# Patient Record
Sex: Female | Born: 1975
Health system: Southern US, Community
[De-identification: ages and names within clinical notes are randomized; demographics above are authoritative.]

## PROBLEM LIST (undated history)

## (undated) DIAGNOSIS — G43909 Migraine, unspecified, not intractable, without status migrainosus: Secondary | ICD-10-CM

---

## 2010-12-05 ENCOUNTER — Ambulatory Visit: Payer: Self-pay | Admitting: Internal Medicine

## 2016-02-27 ENCOUNTER — Encounter: Payer: Self-pay | Admitting: Emergency Medicine

## 2016-02-27 ENCOUNTER — Emergency Department
Admission: EM | Admit: 2016-02-27 | Discharge: 2016-02-27 | Disposition: A | Discharge status: Home / Self Care | Payer: BLUE CROSS/BLUE SHIELD | Attending: Emergency Medicine | Admitting: Emergency Medicine

## 2016-02-27 ENCOUNTER — Emergency Department: Payer: BLUE CROSS/BLUE SHIELD

## 2016-02-27 DIAGNOSIS — G43909 Migraine, unspecified, not intractable, without status migrainosus: Secondary | ICD-10-CM

## 2016-02-27 DIAGNOSIS — R51 Headache: Secondary | ICD-10-CM | POA: Diagnosis present

## 2016-02-27 HISTORY — DX: Migraine, unspecified, not intractable, without status migrainosus: G43.909

## 2016-02-27 MED ORDER — BUTALBITAL-APAP-CAFFEINE 50-325-40 MG PO TABS
2.0000 | ORAL_TABLET | Freq: Once | ORAL | Status: AC
  Administered 2016-02-27: 2 via ORAL
  Filled 2016-02-27: qty 2

## 2016-02-27 MED ORDER — DIPHENHYDRAMINE HCL 25 MG PO CAPS
50.0000 mg | ORAL_CAPSULE | Freq: Once | ORAL | Status: AC
  Administered 2016-02-27: 50 mg via ORAL

## 2016-02-27 MED ORDER — BUTALBITAL-APAP-CAFFEINE 50-325-40 MG PO TABS
ORAL_TABLET | ORAL | Status: AC
  Administered 2016-02-27: 2 via ORAL
  Filled 2016-02-27: qty 1

## 2016-02-27 MED ORDER — BUTALBITAL-APAP-CAFFEINE 50-325-40 MG PO TABS
ORAL_TABLET | ORAL | Status: AC
  Filled 2016-02-27: qty 1

## 2016-02-27 MED ORDER — BUTALBITAL-APAP-CAFFEINE 50-325-40 MG PO TABS: 1.0000 | ORAL_TABLET | Freq: Four times a day (QID) | ORAL | 0 refills | Status: AC | PRN

## 2016-02-27 MED ORDER — DIPHENHYDRAMINE HCL 25 MG PO CAPS
ORAL_CAPSULE | ORAL | Status: AC
  Administered 2016-02-27: 50 mg via ORAL
  Filled 2016-02-27: qty 2

## 2016-02-27 NOTE — ED Provider Notes (Signed)
[facility] Emergency Department Provider Note  Time seen: 6:58 PM  I have reviewed the triage vital signs and the nursing notes.   HISTORY  Chief Complaint Headache    HPI Monica Hall is a 40 y.o. female with a past medical history of migraines who presents the emergency department with a headache. According to the patient for the past 3 days she has been experiencing a significant headache which she describes as a pressure sensation mostly to the top and back of her head. Denies any weakness or numbness. Denies fever. Patient states headache is currently at 8/10, states she has had migraines before but this one is lasting longer than normal. She does state photo and phonophobia which is typical of her migraines.  Past Medical History:  Diagnosis Date  . Migraines     There are no active problems to display for this patient.   Past Surgical History:  Procedure Laterality Date  . CESAREAN SECTION      Prior to Admission medications   Not on File    No Known Allergies  History reviewed. No pertinent family history.  Social History Social History  Substance Use Topics  . Smoking status: Never Smoker  . Smokeless tobacco: Not on file  . Alcohol use No    Review of Systems Constitutional: Negative for fever. Cardiovascular: Negative for chest pain. Respiratory: Negative for shortness of breath. Gastrointestinal: Negative for abdominal pain Neurological: 8/10 headache. Denies focal weakness or numbness. 10-point ROS otherwise negative.  ____________________________________________   PHYSICAL EXAM:  VITAL SIGNS: ED Triage Vitals  Enc Vitals Group     BP 02/27/16 1837 135/69     Pulse Rate 02/27/16 1837 75     Resp 02/27/16 1837 18     Temp 02/27/16 1837 97.6 F (36.4 C)     Temp Source 02/27/16 1837 Oral     SpO2 02/27/16 1837 97 %     Weight 02/27/16 1835 235 lb (106.6 kg)     Height 02/27/16 1835 5\' 4"  (1.626 m)     Head  Circumference --      Peak Flow --      Pain Score 02/27/16 1835 7     Pain Loc --      Pain Edu? --      Excl. in GC? --     Constitutional: Alert and oriented. Well appearing and in no distress. Eyes: Normal exam ENT   Head: Normocephalic and atraumatic.   Mouth/Throat: Mucous membranes are moist. Cardiovascular: Normal rate, regular rhythm. No murmur Respiratory: Normal respiratory effort without tachypnea nor retractions. Breath sounds are clear Gastrointestinal: Soft and nontender. No distention. Musculoskeletal: Nontender with normal range of motion in all extremities.  Neurologic:  Normal speech and language. No gross focal neurologic deficits. Equal grip strengths. Skin:  Skin is warm, dry and intact.  Psychiatric: Mood and affect are normal. Speech and behavior are normal.   ____________________________________________   RADIOLOGY  CT negative  ____________________________________________   INITIAL IMPRESSION / ASSESSMENT AND PLAN / ED COURSE  Pertinent labs & imaging results that were available during my care of the patient were reviewed by me and considered in my medical decision making (see chart for details).  Overall well-appearing patient who states an 8/10 headache/pressure sensation to the top back for head. Normal exam, normal neurological exam. Overall appears well. She describes a pressure sensation which she states is somewhat different than her typical migraine but she does have photo and phonophobia  which is typical for migraine. We'll obtain a CT scan head as the patient states she has never had a CT and only started experiencing migraines 2 years ago. We will treat with oral medications and closely monitor in the emergency department.   CT negative. Patient states her headache is much improved but still present. I discussed further treatment with Toradol, the patient states she is ready to go home, we'll prescribe Fioricet and have the patient  follow up with neurology if she continues to have headaches.   ____________________________________________   FINAL CLINICAL IMPRESSION(S) / ED DIAGNOSES  Migraine headache   Minna Antis, MD 02/27/16 2012

## 2016-02-27 NOTE — ED Triage Notes (Signed)
Feels like hears a "popping" noise. Pt ambulatory in triage.

## 2016-02-27 NOTE — ED Triage Notes (Signed)
Pt reports had had these headaches for past 2 or 3 months on and off. Started with one today and reports has not gone away and normally goes away with her medication. Pt feels jittery. Speech clear. Denies vision changes. Feels like having trouble focusing because of pressure in head.

## 2017-01-15 IMAGING — CT CT HEAD W/O CM
3 series · 14 of 44 positions shown, 16 images · non-contrast
Comparison: None.

CLINICAL DATA: Intermittent headaches over the past 2 3 months.
Sensation of head pressure.

EXAM:
CT HEAD WITHOUT CONTRAST
TECHNIQUE: Contiguous axial images were obtained from the base of the skull
through the vertex without intravenous contrast.

[Series 2: head wo · axial · 0.41mm/px · z∈[-38,+72]mm · 8 of 27 slices shown, 10 images]
[im 3/27  brain]
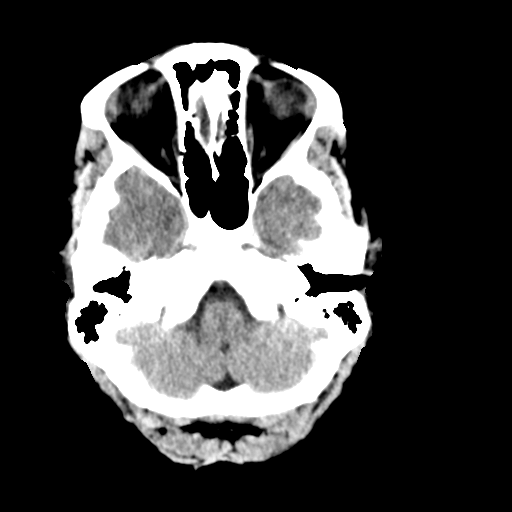
[im 3/27  bone]
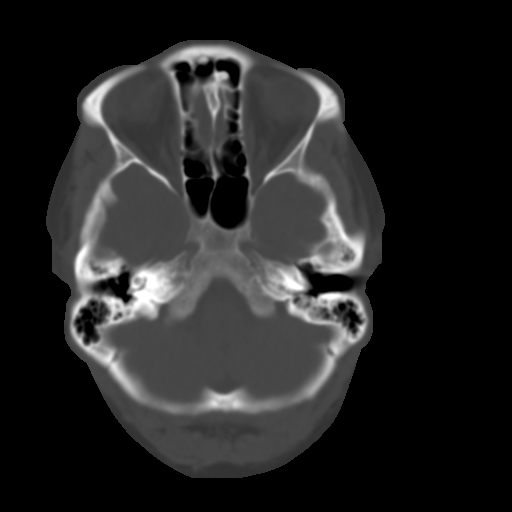
[im 6/27  brain]
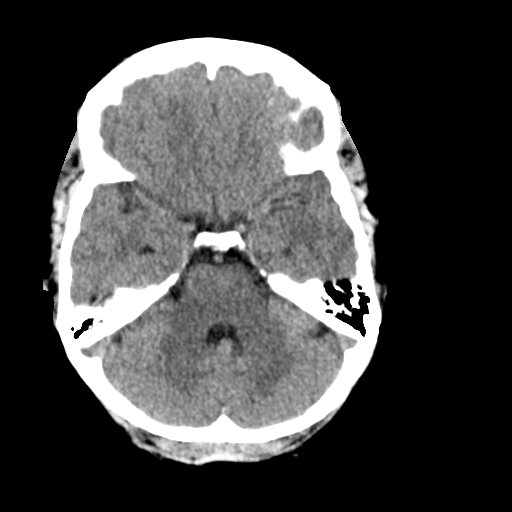
[im 9/27  brain]
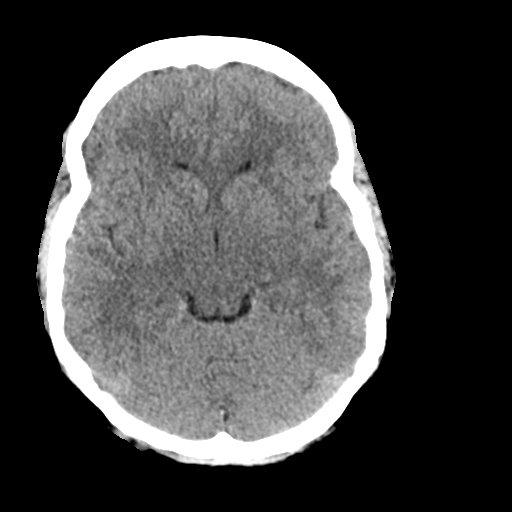
[im 12/27  brain]
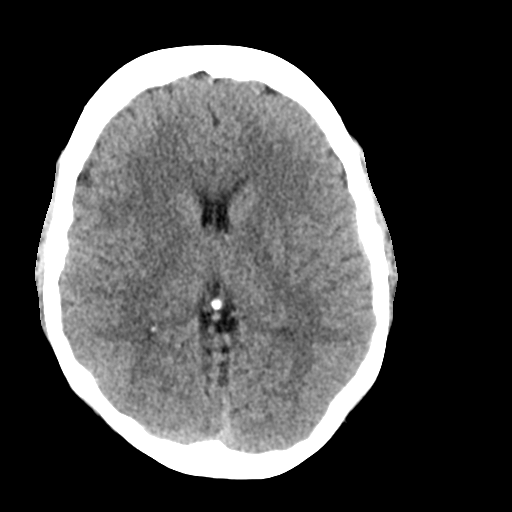
[im 16/27  brain]
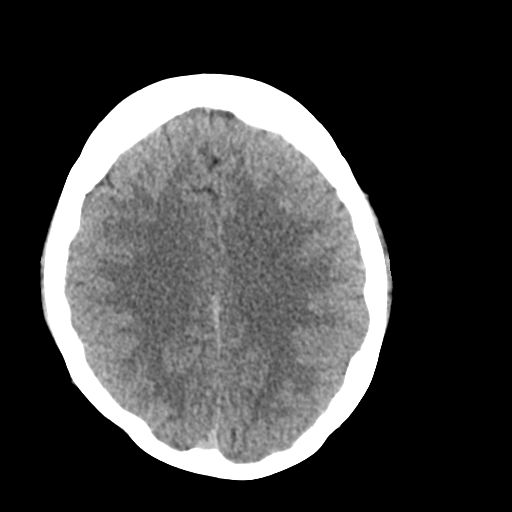
[im 16/27  bone]
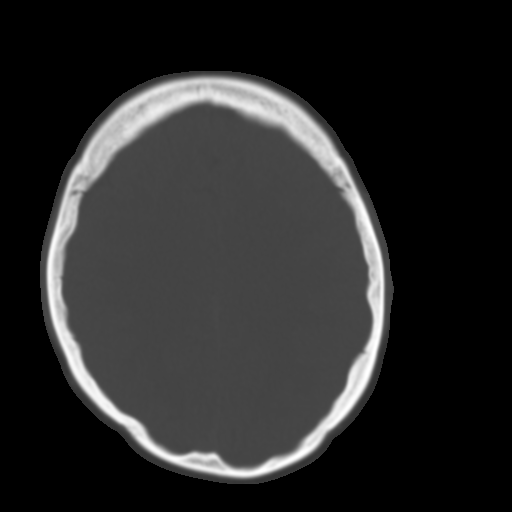
[im 19/27  brain]
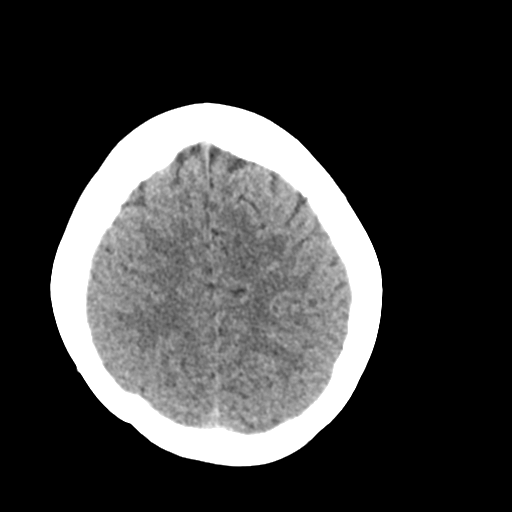
[im 22/27  brain]
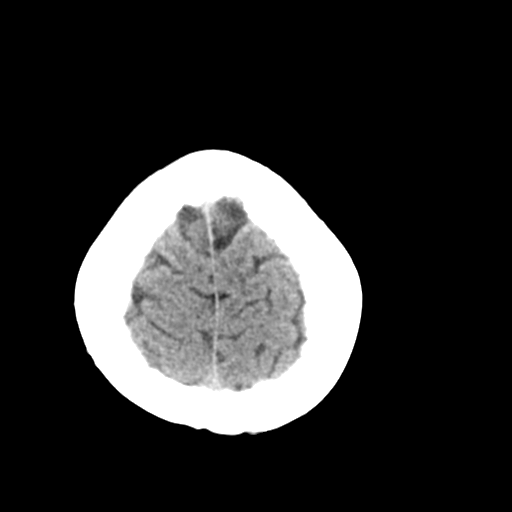
[im 25/27  brain]
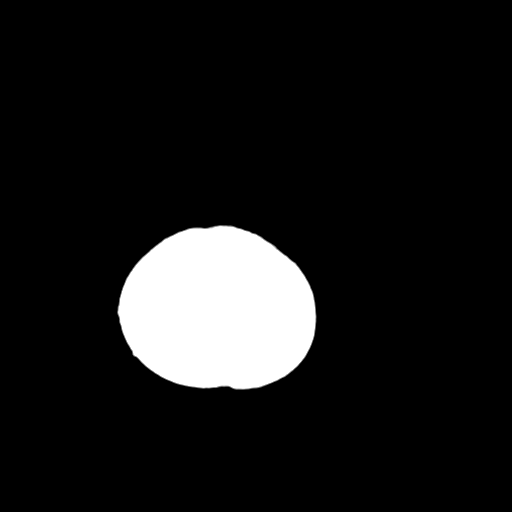

[Series 4: coronal soft tissue · coronal · 0.27mm/px · 3 of 60 slices shown]
[im 20/60  brain]
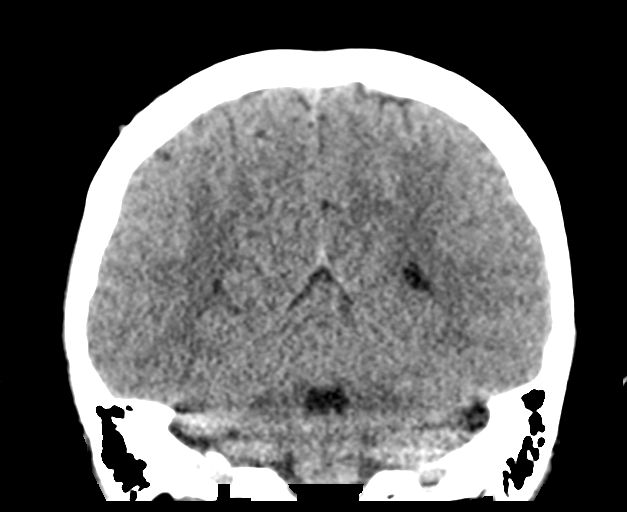
[im 27/60  brain]
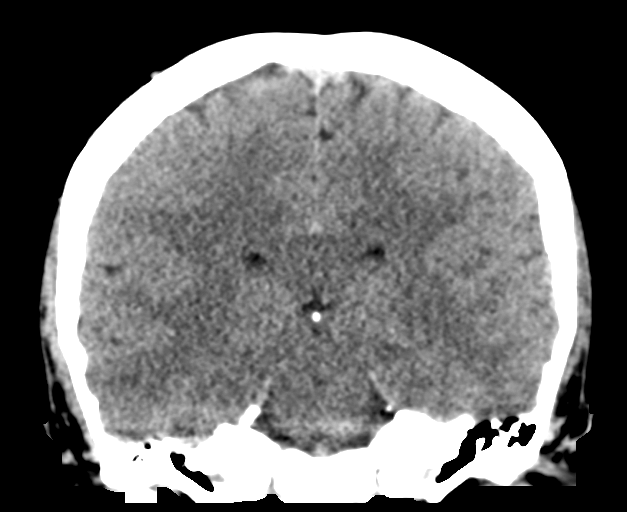
[im 33/60  brain]
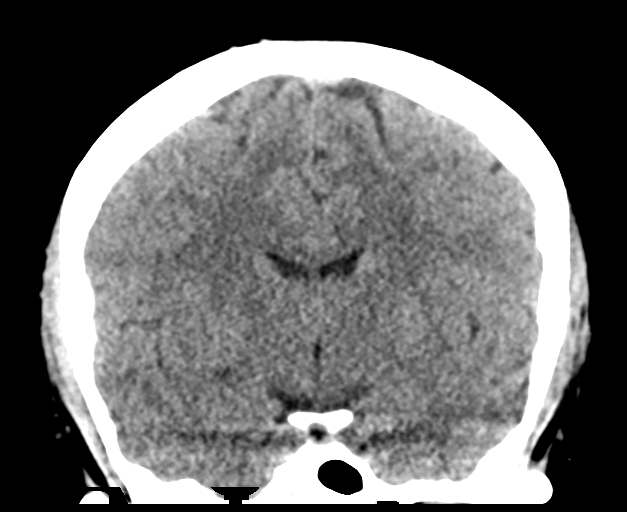

[Series 5: sagittal soft tissue · sagittal · 0.27mm/px · 3 of 53 slices shown]
[im 18/53  brain]
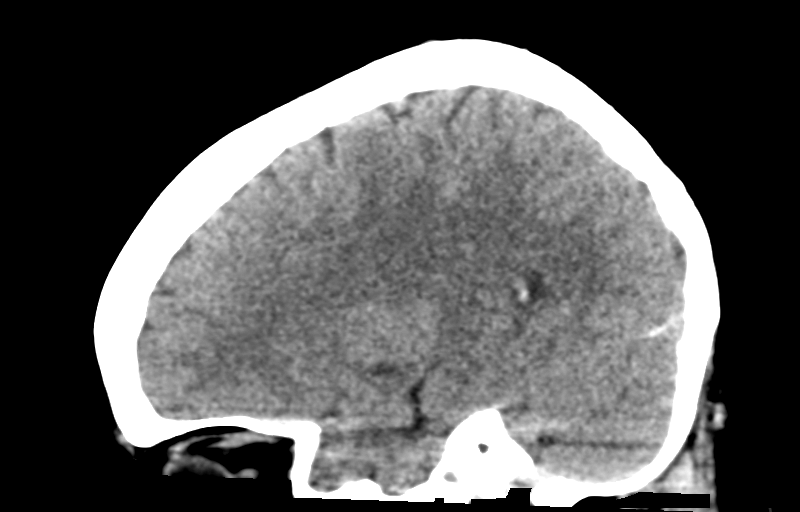
[im 27/53  brain]
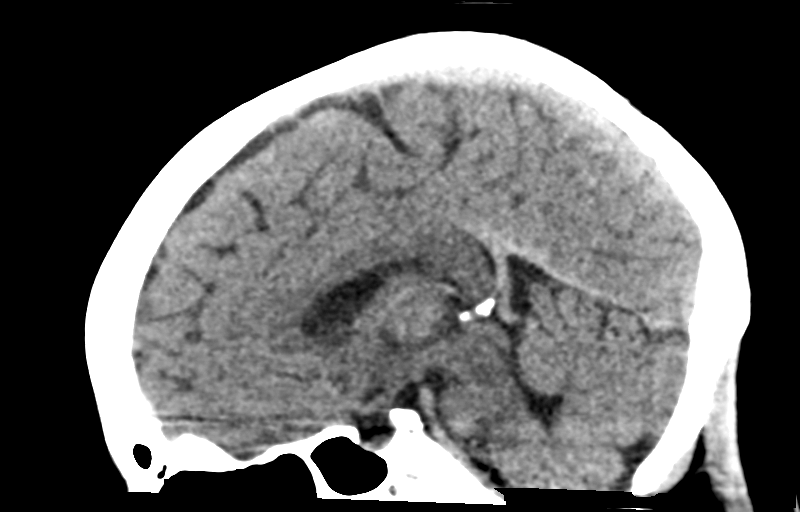
[im 35/53  brain]
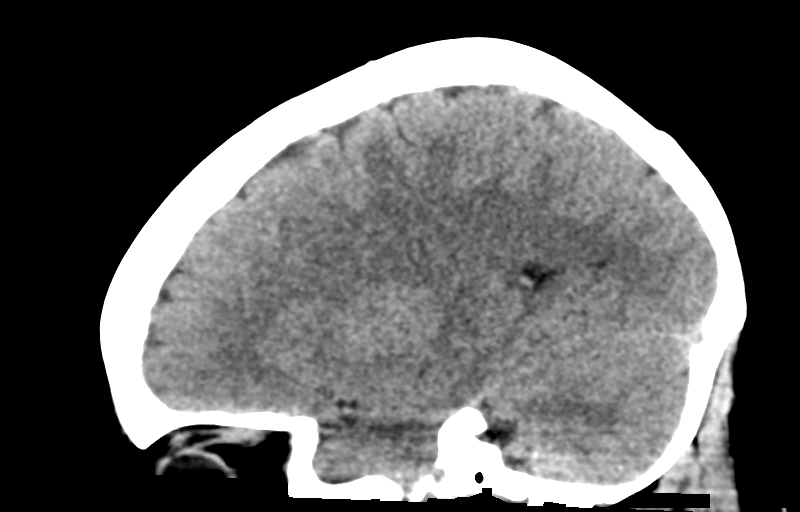

[14 of 44 positions shown; findings below may reference images not displayed]

FINDINGS: Brain: The brainstem, cerebellum, cerebral peduncles, thalami, basal
ganglia, basilar cisterns, and ventricular system appear within
normal limits. No intracranial hemorrhage, mass lesion, or acute
CVA.

Vascular: Unremarkable

Skull: Unremarkable

Sinuses/Orbits: Normal where visualized.

Other: No supplemental non-categorized findings.
IMPRESSION: 1.  No significant abnormality identified.

## 2018-07-27 ENCOUNTER — Encounter: Payer: Self-pay | Admitting: Family Medicine

## 2018-07-27 ENCOUNTER — Ambulatory Visit (INDEPENDENT_AMBULATORY_CARE_PROVIDER_SITE_OTHER): Payer: BLUE CROSS/BLUE SHIELD | Admitting: Family Medicine

## 2018-07-27 VITALS — BP 145/85 | HR 89 | Ht 65.0 in | Wt 244.0 lb

## 2018-07-27 DIAGNOSIS — Z01419 Encounter for gynecological examination (general) (routine) without abnormal findings: Secondary | ICD-10-CM

## 2018-07-27 DIAGNOSIS — Z0000 Encounter for general adult medical examination without abnormal findings: Secondary | ICD-10-CM

## 2018-07-27 DIAGNOSIS — R5383 Other fatigue: Secondary | ICD-10-CM

## 2018-07-27 NOTE — Progress Notes (Signed)
Last Pap pt thinks last Pap was when she got Mirena 2015, pt states it was normal. Mirena is due to come out and pt would like discuss options

## 2018-07-27 NOTE — Progress Notes (Signed)
   GYNECOLOGY ANNUAL PREVENTATIVE CARE ENCOUNTER NOTE  Subjective:   Monica Hall is a 43 y.o. [id number] female here for a routine annual gynecologic exam.  Current complaints: migraines, IUD in place since 2015, weight gain.   Denies abnormal vaginal bleeding, discharge, pelvic pain, problems with intercourse or other gynecologic concerns.    Gynecologic History No LMP recorded. (Menstrual status: IUD). Contraception: IUD Last Pap: in 2015. Results were: normal Last mammogram: NA@50   The following portions of the patient's history were reviewed and updated as appropriate: allergies, current medications, past family history, past medical history, past social history, past surgical history and problem list.  Review of Systems Pertinent items are noted in HPI.   Objective:  BP (!) 145/85   Pulse 89   Ht 5\' 5"  (1.651 m)   Wt 244 lb (110.7 kg)   BMI 40.60 kg/m  CONSTITUTIONAL: Well-developed, well-nourished female in no acute distress.  HENT:  Normocephalic, atraumatic, External right and left ear normal. Oropharynx is clear and moist EYES:  No scleral icterus.  NECK: Normal range of motion, supple, no masses.  Normal thyroid.  SKIN: Skin is warm and dry. No rash noted. Not diaphoretic. No erythema. No pallor. NEUROLOGIC: Alert and oriented to person, place, and time. Normal reflexes, muscle tone coordination. No cranial nerve deficit noted. PSYCHIATRIC: Normal mood and affect. Normal behavior. Normal judgment and thought content. CARDIOVASCULAR: Normal heart rate noted, regular rhythm. 2+ distal pulses. RESPIRATORY: Effort and breath sounds normal, no problems with respiration noted. BREASTS: Symmetric in size. No masses, skin changes, nipple drainage, or lymphadenopathy. ABDOMEN: Soft,  no distention noted.  No tenderness, rebound or guarding.  PELVIC: deferred until IUD placement MUSCULOSKELETAL: Normal range of motion.   Assessment and Plan:  1) Annual gynecologic examination:  Pap at IUD insertion visit.  Will follow up results of pap smear and manage accordingly.  Declines STI screen.  Routine preventative health maintenance measures emphasized.  2) Contraception counseling: Reviewed all forms of birth control options available including abstinence; over the counter/barrier methods; hormonal contraceptive medication including pill, patch, ring, injection,contraceptive implant; hormonal and nonhormonal IUDs; permanent sterilization options including vasectomy and the various tubal sterilization modalities. Risks and benefits reviewed.  Questions were answered.  Written information was also given to the patient to review.  Patient desires IUD removal/reinsertion- will be scheduled.  She was told to call with any further questions, or with any concerns about this method of contraception.  Emphasized use of condoms 100% of the time for STI prevention.  3) Weight gain: counseled on calorie restriction and increasing exercise. TSH, HA1C and CBC ordered for weight gain and fatigue.   4) Migraines- helped by chiropractic care.   Please refer to After Visit Summary for other counseling recommendations.   Return in about 2 weeks (around 08/10/2018) for IUD insertion/removal and pap smear.  Federico Flake, MD, MPH, ABFM Attending Physician Faculty Practice- Center for [facility]

## 2018-07-27 NOTE — Addendum Note (Signed)
Addended by: Geanie Berlin on: 07/27/2018 05:10 PM   Modules accepted: Level of Service

## 2018-07-28 ENCOUNTER — Telehealth: Payer: Self-pay | Admitting: *Deleted

## 2018-07-28 LAB — CBC
HEMATOCRIT: 39.1 % (ref 34.0–46.6)
Hemoglobin: 13.4 g/dL (ref 11.1–15.9)
MCH: 29.7 pg (ref 26.6–33.0)
MCHC: 34.3 g/dL (ref 31.5–35.7)
MCV: 87 fL (ref 79–97)
Platelets: 275 10*3/uL (ref 150–450)
RBC: 4.51 x10E6/uL (ref 3.77–5.28)
RDW: 12.4 % (ref 11.7–15.4)
WBC: 6.9 10*3/uL (ref 3.4–10.8)

## 2018-07-28 LAB — HEMOGLOBIN A1C
Est. average glucose Bld gHb Est-mCnc: 117 mg/dL
Hgb A1c MFr Bld: 5.7 % — ABNORMAL HIGH (ref 4.8–5.6)

## 2018-07-28 LAB — TSH: TSH: 1.88 u[IU]/mL (ref 0.450–4.500)

## 2018-07-28 NOTE — Telephone Encounter (Signed)
Pt called back, informed of her lab results and Dr Alvester Morin recommending referral to nutrition. Pt would like to hold off on referral for now, she states that her and Dr Alvester Morin discussed things she can do now and pt would like to try those first and if she decides she wants to get referral she will call office back.   Scheryl Marten, RN

## 2018-07-28 NOTE — Telephone Encounter (Signed)
-----   Message from Kimberly Niles Newton, MD sent at 07/28/2018  8:29 AM EST ----- Patient has prediabetes. I recommend a referral to nutrition and possible enrollment in a diabetes prevention group. 

## 2018-07-28 NOTE — Telephone Encounter (Signed)
-----   Message from Federico Flake, MD sent at 07/28/2018  8:29 AM EST ----- Patient has prediabetes. I recommend a referral to nutrition and possible enrollment in a diabetes prevention group.

## 2018-08-02 ENCOUNTER — Encounter: Payer: Self-pay | Admitting: Radiology

## 2018-08-10 ENCOUNTER — Encounter: Payer: Self-pay | Admitting: Family Medicine

## 2018-08-10 ENCOUNTER — Ambulatory Visit (INDEPENDENT_AMBULATORY_CARE_PROVIDER_SITE_OTHER): Payer: BLUE CROSS/BLUE SHIELD | Admitting: Family Medicine

## 2018-08-10 ENCOUNTER — Encounter: Payer: Self-pay | Admitting: *Deleted

## 2018-08-10 VITALS — BP 137/81 | HR 80 | Wt 243.0 lb

## 2018-08-10 DIAGNOSIS — Z124 Encounter for screening for malignant neoplasm of cervix: Secondary | ICD-10-CM | POA: Diagnosis not present

## 2018-08-10 DIAGNOSIS — Z30433 Encounter for removal and reinsertion of intrauterine contraceptive device: Secondary | ICD-10-CM | POA: Diagnosis not present

## 2018-08-10 DIAGNOSIS — Z3202 Encounter for pregnancy test, result negative: Secondary | ICD-10-CM

## 2018-08-10 DIAGNOSIS — Z30430 Encounter for insertion of intrauterine contraceptive device: Secondary | ICD-10-CM

## 2018-08-10 DIAGNOSIS — Z1151 Encounter for screening for human papillomavirus (HPV): Secondary | ICD-10-CM | POA: Diagnosis not present

## 2018-08-10 DIAGNOSIS — Z0000 Encounter for general adult medical examination without abnormal findings: Secondary | ICD-10-CM

## 2018-08-10 LAB — POCT URINE PREGNANCY: PREG TEST UR: NEGATIVE

## 2018-08-10 MED ORDER — LEVONORGESTREL 20 MCG/24HR IU IUD
INTRAUTERINE_SYSTEM | Freq: Once | INTRAUTERINE | Status: AC
  Administered 2018-08-10: 14:00:00 via INTRAUTERINE

## 2018-08-10 NOTE — Progress Notes (Signed)
   GYNECOLOGY CLINIC PROCEDURE NOTE  Monica Hall is a 43 y.o. [phone] here for  IUD: Mirena IUD insertion. No GYN concerns.    Pap collected today, wellness exam was 07/27/2018 and declined pelvic at that time   BP 137/81   Pulse 80   Wt 243 lb (110.2 kg)   BMI 40.44 kg/m   Gen: well appearing, NAD HEENT: no scleral icterus CV: RR Lung: Normal WOB Ext: warm well perfused  Breasts: breasts appear normal, no suspicious masses, no skin or nipple changes or axillary nodes. Pelvic exam: normal external genitalia, vulva, vagina, cervix, uterus and adnexa.   IUD Insertion Procedure Note Patient identified, informed consent performed, consent signed.   Discussed risks of irregular bleeding, cramping, infection, malpositioning or misplacement of the IUD outside the uterus which may require further procedure such as laparoscopy. Time out was performed.  Urine pregnancy test negative.  Speculum placed in the vagina.  Cervix visualized.  Cleaned with Betadine x 2.  Grasped anteriorly with a single tooth tenaculum.  Uterus sounded to 8 cm.  IUD placed per manufacturer's recommendations.  Strings trimmed to 3 cm. Tenaculum was removed, good hemostasis noted.  Patient tolerated procedure well.   Patient was given post-procedure instructions.  She was advised to have backup contraception for one week.  Patient was also asked to check IUD strings periodically and follow up in 4 weeks for IUD check.  IUD Removal  Patient identified, informed consent performed, consent signed.  Patient was in the dorsal lithotomy position, normal external genitalia was noted.  A speculum was placed in the patient's vagina, normal discharge was noted, no lesions. The cervix was visualized, no lesions, no abnormal discharge.  The strings of the IUD were grasped and pulled using ring forceps. The IUD was removed in its entirety.   Federico Flake, MD  Faculty Practice  Center for [employer], [facility]

## 2018-08-15 ENCOUNTER — Telehealth: Payer: Self-pay | Admitting: *Deleted

## 2018-08-15 ENCOUNTER — Telehealth: Payer: Self-pay

## 2018-08-15 LAB — CYTOLOGY - PAP
DIAGNOSIS: NEGATIVE
HPV: NOT DETECTED

## 2018-08-15 NOTE — Telephone Encounter (Signed)
Left message to call for results

## 2018-08-15 NOTE — Telephone Encounter (Signed)
Called patient to make her aware of lab results. Lmtcb

## 2018-08-15 NOTE — Telephone Encounter (Signed)
-----   Message from Federico Flake, MD sent at 08/15/2018  8:44 AM EST ----- Pap NIL with negative high risk HPV. Next pap in 5 years.

## 2019-01-16 DIAGNOSIS — M5413 Radiculopathy, cervicothoracic region: Secondary | ICD-10-CM | POA: Diagnosis not present

## 2019-01-16 DIAGNOSIS — M531 Cervicobrachial syndrome: Secondary | ICD-10-CM | POA: Diagnosis not present

## 2019-01-16 DIAGNOSIS — M9901 Segmental and somatic dysfunction of cervical region: Secondary | ICD-10-CM | POA: Diagnosis not present

## 2019-01-17 DIAGNOSIS — M9901 Segmental and somatic dysfunction of cervical region: Secondary | ICD-10-CM | POA: Diagnosis not present

## 2019-01-17 DIAGNOSIS — M531 Cervicobrachial syndrome: Secondary | ICD-10-CM | POA: Diagnosis not present

## 2019-01-17 DIAGNOSIS — M5413 Radiculopathy, cervicothoracic region: Secondary | ICD-10-CM | POA: Diagnosis not present

## 2019-01-19 DIAGNOSIS — M9901 Segmental and somatic dysfunction of cervical region: Secondary | ICD-10-CM | POA: Diagnosis not present

## 2019-01-19 DIAGNOSIS — M531 Cervicobrachial syndrome: Secondary | ICD-10-CM | POA: Diagnosis not present

## 2019-01-19 DIAGNOSIS — M5413 Radiculopathy, cervicothoracic region: Secondary | ICD-10-CM | POA: Diagnosis not present

## 2019-01-23 DIAGNOSIS — M9901 Segmental and somatic dysfunction of cervical region: Secondary | ICD-10-CM | POA: Diagnosis not present

## 2019-01-23 DIAGNOSIS — M531 Cervicobrachial syndrome: Secondary | ICD-10-CM | POA: Diagnosis not present

## 2019-01-23 DIAGNOSIS — M5413 Radiculopathy, cervicothoracic region: Secondary | ICD-10-CM | POA: Diagnosis not present

## 2019-01-30 DIAGNOSIS — R6889 Other general symptoms and signs: Secondary | ICD-10-CM | POA: Diagnosis not present

## 2019-01-30 DIAGNOSIS — Z209 Contact with and (suspected) exposure to unspecified communicable disease: Secondary | ICD-10-CM | POA: Diagnosis not present

## 2019-01-30 DIAGNOSIS — Z03818 Encounter for observation for suspected exposure to other biological agents ruled out: Secondary | ICD-10-CM | POA: Diagnosis not present

## 2019-01-30 DIAGNOSIS — R110 Nausea: Secondary | ICD-10-CM | POA: Diagnosis not present

## 2019-02-02 DIAGNOSIS — Z20828 Contact with and (suspected) exposure to other viral communicable diseases: Secondary | ICD-10-CM | POA: Diagnosis not present

## 2019-02-02 DIAGNOSIS — R200 Anesthesia of skin: Secondary | ICD-10-CM | POA: Diagnosis not present

## 2019-06-26 ENCOUNTER — Encounter: Payer: Self-pay | Admitting: Radiology

## 2019-09-06 ENCOUNTER — Other Ambulatory Visit: Payer: Self-pay

## 2019-09-06 ENCOUNTER — Encounter: Payer: Self-pay | Admitting: Family Medicine

## 2019-09-06 ENCOUNTER — Ambulatory Visit (INDEPENDENT_AMBULATORY_CARE_PROVIDER_SITE_OTHER): Payer: BLUE CROSS/BLUE SHIELD | Admitting: Family Medicine

## 2019-09-06 VITALS — BP 112/73 | HR 73 | Wt 205.0 lb

## 2019-09-06 DIAGNOSIS — Z0000 Encounter for general adult medical examination without abnormal findings: Secondary | ICD-10-CM | POA: Diagnosis not present

## 2019-09-06 DIAGNOSIS — E669 Obesity, unspecified: Secondary | ICD-10-CM | POA: Insufficient documentation

## 2019-09-06 DIAGNOSIS — R7303 Prediabetes: Secondary | ICD-10-CM | POA: Insufficient documentation

## 2019-09-06 NOTE — Progress Notes (Signed)
   GYNECOLOGY PROBLEM  VISIT ENCOUNTER NOTE  Subjective:   Monica Hall is a 44 y.o. G46P4003 female here for a a problem GYN visit.  Current complaints: None, wants labs but no other exams.   Denies abnormal vaginal bleeding, discharge, pelvic pain, problems with intercourse or other gynecologic concerns.    Gynecologic History No LMP recorded. (Menstrual status: IUD). Contraception: IUD  Health Maintenance Due  Topic Date Due  . HIV Screening  06/02/1990  . INFLUENZA VACCINE  01/28/2019     The following portions of the patient's history were reviewed and updated as appropriate: allergies, current medications, past family history, past medical history, past social history, past surgical history and problem list.  Review of Systems Pertinent items are noted in HPI.   Objective:  BP 112/73   Pulse 73   Wt 205 lb (93 kg)   BMI 34.11 kg/m    Wt Readings from Last 3 Encounters:  09/06/19 205 lb (93 kg)  08/10/18 243 lb (110.2 kg)  07/27/18 244 lb (110.7 kg)   Gen: well appearing, NAD HEENT: no scleral icterus. Non enlarged thyroid CV: RR Lung: Normal WOB Ext: warm well perfused Breast: declined  PELVIC: Declined  Assessment and Plan:  1. Well woman exam (no gynecological exam) Declined breast and pelvic exam today Has been on low carb diet with good weight loss result and feels "much better" Lost 40lbs!!!! Congratulated patient - Hemoglobin A1c- was pre-diabetes last year and hopes  - CBC - TSH - Lipid panel  Please refer to After Visit Summary for other counseling recommendations.   Return in about 1 year (around 09/05/2020) for Yearly wellness exam.  Federico Flake, MD, MPH, ABFM Attending Physician Faculty Practice- Center for [facility]

## 2019-09-06 NOTE — Progress Notes (Signed)
Last pap 08/10/2018-normal  Does not want a breast exam Never had a mammogram Blood work today.

## 2019-09-07 ENCOUNTER — Telehealth: Payer: Self-pay | Admitting: *Deleted

## 2019-09-07 LAB — CBC
Hematocrit: 40.7 % (ref 34.0–46.6)
Hemoglobin: 13.7 g/dL (ref 11.1–15.9)
MCH: 29.9 pg (ref 26.6–33.0)
MCHC: 33.7 g/dL (ref 31.5–35.7)
MCV: 89 fL (ref 79–97)
Platelets: 248 10*3/uL (ref 150–450)
RBC: 4.58 x10E6/uL (ref 3.77–5.28)
RDW: 12.6 % (ref 11.7–15.4)
WBC: 7.5 10*3/uL (ref 3.4–10.8)

## 2019-09-07 LAB — LIPID PANEL
Chol/HDL Ratio: 3.8 ratio (ref 0.0–4.4)
Cholesterol, Total: 203 mg/dL — ABNORMAL HIGH (ref 100–199)
HDL: 54 mg/dL (ref >39)
LDL Chol Calc (NIH): 133 mg/dL — ABNORMAL HIGH (ref 0–99)
Triglycerides: 91 mg/dL (ref 0–149)
VLDL Cholesterol Cal: 16 mg/dL (ref 5–40)

## 2019-09-07 LAB — HEMOGLOBIN A1C
Est. average glucose Bld gHb Est-mCnc: 111 mg/dL
Hgb A1c MFr Bld: 5.5 % (ref 4.8–5.6)

## 2019-09-07 LAB — TSH: TSH: 2.36 u[IU]/mL (ref 0.450–4.500)

## 2019-09-07 NOTE — Telephone Encounter (Signed)
Pt informed of lab results and made aware of Dr Alvester Morin comments.

## 2019-09-07 NOTE — Telephone Encounter (Signed)
Left message for pt to call back  °

## 2019-09-07 NOTE — Telephone Encounter (Signed)
-----   Message from Kimberly Niles Newton, MD sent at 09/07/2019  1:06 PM EST ----- HA1C is improved and out of the prediabetes range. All of her hard work and carb reduction has helped this. Total Cholesterol only slightly elevated at 203 which is not concerning.  

## 2019-09-07 NOTE — Telephone Encounter (Signed)
-----   Message from Federico Flake, MD sent at 09/07/2019  1:06 PM EST ----- HA1C is improved and out of the prediabetes range. All of her hard work and carb reduction has helped this. Total Cholesterol only slightly elevated at 203 which is not concerning.

## 2020-07-16 ENCOUNTER — Encounter: Payer: Self-pay | Admitting: Radiology

## 2020-07-29 DIAGNOSIS — U071 COVID-19: Secondary | ICD-10-CM | POA: Diagnosis not present

## 2020-07-29 DIAGNOSIS — Z20822 Contact with and (suspected) exposure to covid-19: Secondary | ICD-10-CM | POA: Diagnosis not present

## 2020-08-03 DIAGNOSIS — Z20822 Contact with and (suspected) exposure to covid-19: Secondary | ICD-10-CM | POA: Diagnosis not present

## 2020-08-07 ENCOUNTER — Encounter: Payer: Self-pay | Admitting: *Deleted

## 2020-08-07 ENCOUNTER — Other Ambulatory Visit: Payer: Self-pay

## 2020-08-07 ENCOUNTER — Ambulatory Visit (INDEPENDENT_AMBULATORY_CARE_PROVIDER_SITE_OTHER): Payer: BLUE CROSS/BLUE SHIELD | Admitting: *Deleted

## 2020-08-07 DIAGNOSIS — U099 Post covid-19 condition, unspecified: Secondary | ICD-10-CM

## 2020-08-07 DIAGNOSIS — Z8616 Personal history of COVID-19: Secondary | ICD-10-CM

## 2020-08-07 NOTE — Progress Notes (Signed)
Pt asking if Dr Alvester Morin will fill out a physician statement for pt to return to work from having Covid.

## 2020-08-08 NOTE — Progress Notes (Signed)
Attestation of Attending Supervision of clinical support staff: I agree with the care provided to this patient and was available for any consultation.  I have reviewed the RN's note and chart.   I saw the patient to fill our her necessary paperwork.    She was able to show me COVID 19 test results from [facility]. She tested positive on 1/31 and also 2/5. She was still having symptoms after 5 days of isolation and therefor did not return to work. She has remained out of work for 10 days and can now return to full duties. She was following CDC and company policy.  At day 5 of illness she was still having significant symptoms and therefore should not have returned to a work setting. I signed the required form to release her back to work on 08/08/2020   Federico Flake, MD, MPH, ABFM Attending Physician Faculty Practice- Center for [facility]
